# Patient Record
Sex: Female | Born: 1995 | Race: White | Hispanic: Yes | Marital: Single | Smoking: Never smoker
Health system: Southern US, Community
[De-identification: ages and names within clinical notes are randomized; demographics above are authoritative.]

## PROBLEM LIST (undated history)

## (undated) HISTORY — PX: TONSILLECTOMY: SUR1361

## (undated) HISTORY — PX: ADENOIDECTOMY: SHX5191

---
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
## 2016-02-04 ENCOUNTER — Encounter (HOSPITAL_COMMUNITY): Payer: Self-pay | Admitting: Emergency Medicine

## 2016-02-04 ENCOUNTER — Emergency Department (HOSPITAL_COMMUNITY)
Admission: EM | Admit: 2016-02-04 | Discharge: 2016-02-05 | Disposition: A | Discharge status: Home / Self Care | Payer: Medicaid Other | Attending: Emergency Medicine | Admitting: Emergency Medicine

## 2016-02-04 DIAGNOSIS — K529 Noninfective gastroenteritis and colitis, unspecified: Secondary | ICD-10-CM | POA: Diagnosis not present

## 2016-02-04 DIAGNOSIS — R1031 Right lower quadrant pain: Secondary | ICD-10-CM

## 2016-02-04 DIAGNOSIS — R1013 Epigastric pain: Secondary | ICD-10-CM | POA: Diagnosis present

## 2016-02-04 LAB — COMPREHENSIVE METABOLIC PANEL
ALK PHOS: 50 U/L (ref 38–126)
ALT: 12 U/L — AB (ref 14–54)
AST: 18 U/L (ref 15–41)
Albumin: 4.1 g/dL (ref 3.5–5.0)
Anion gap: 7 (ref 5–15)
BILIRUBIN TOTAL: 0.5 mg/dL (ref 0.3–1.2)
BUN: 5 mg/dL — ABNORMAL LOW (ref 6–20)
CALCIUM: 9.2 mg/dL (ref 8.9–10.3)
CO2: 26 mmol/L (ref 22–32)
CREATININE: 0.66 mg/dL (ref 0.44–1.00)
Chloride: 105 mmol/L (ref 101–111)
Glucose, Bld: 102 mg/dL — ABNORMAL HIGH (ref 65–99)
Potassium: 3.6 mmol/L (ref 3.5–5.1)
Sodium: 138 mmol/L (ref 135–145)
TOTAL PROTEIN: 6.8 g/dL (ref 6.5–8.1)

## 2016-02-04 LAB — CBC
HCT: 28.4 % — ABNORMAL LOW (ref 36.0–46.0)
Hemoglobin: 8.7 g/dL — ABNORMAL LOW (ref 12.0–15.0)
MCH: 20.3 pg — AB (ref 26.0–34.0)
MCHC: 30.6 g/dL (ref 30.0–36.0)
MCV: 66.2 fL — ABNORMAL LOW (ref 78.0–100.0)
PLATELETS: 220 10*3/uL (ref 150–400)
RBC: 4.29 MIL/uL (ref 3.87–5.11)
RDW: 17.4 % — ABNORMAL HIGH (ref 11.5–15.5)
WBC: 7.5 10*3/uL (ref 4.0–10.5)

## 2016-02-04 LAB — URINALYSIS, MICROSCOPIC (REFLEX)

## 2016-02-04 LAB — URINALYSIS, ROUTINE W REFLEX MICROSCOPIC
Bilirubin Urine: NEGATIVE
Glucose, UA: NEGATIVE mg/dL
Ketones, ur: NEGATIVE mg/dL
NITRITE: NEGATIVE
PROTEIN: NEGATIVE mg/dL
Specific Gravity, Urine: 1.01 (ref 1.005–1.030)
pH: 6.5 (ref 5.0–8.0)

## 2016-02-04 LAB — I-STAT BETA HCG BLOOD, ED (MC, WL, AP ONLY): I-stat hCG, quantitative: <5 m[IU]/mL (ref <5)

## 2016-02-04 LAB — LIPASE, BLOOD: Lipase: 19 U/L (ref 11–51)

## 2016-02-04 MED ORDER — SODIUM CHLORIDE 0.9 % IV BOLUS (SEPSIS)
1000.0000 mL | Freq: Once | INTRAVENOUS | Status: AC
  Administered 2016-02-05: 1000 mL via INTRAVENOUS

## 2016-02-04 MED ORDER — ONDANSETRON HCL 4 MG/2ML IJ SOLN
4.0000 mg | Freq: Once | INTRAMUSCULAR | Status: AC
  Administered 2016-02-05: 4 mg via INTRAVENOUS
  Filled 2016-02-04: qty 2

## 2016-02-04 MED ORDER — MORPHINE SULFATE (PF) 4 MG/ML IV SOLN
4.0000 mg | Freq: Once | INTRAVENOUS | Status: AC
  Administered 2016-02-05: 4 mg via INTRAVENOUS
  Filled 2016-02-04: qty 1

## 2016-02-04 MED ORDER — IOPAMIDOL (ISOVUE-300) INJECTION 61%
INTRAVENOUS | Status: AC
  Filled 2016-02-04: qty 30

## 2016-02-04 NOTE — ED Triage Notes (Signed)
Pt. Stated, I'v had abdominal pain in the middle toward the right since last night. Its an aching pain that's constant , no other symptoms.

## 2016-02-04 NOTE — ED Notes (Signed)
Pt having substernal pain 8/10 starting last night. Pt has taken Advil 400mg  for the pain with no relief. Pt had last BM last night, is passing flatus, and feels nauseous but has not vomited.

## 2016-02-05 ENCOUNTER — Emergency Department (HOSPITAL_COMMUNITY): Payer: Medicaid Other

## 2016-02-05 MED ORDER — IOPAMIDOL (ISOVUE-300) INJECTION 61%
INTRAVENOUS | Status: AC
  Administered 2016-02-05: 100 mL
  Filled 2016-02-05: qty 100

## 2016-02-05 MED ORDER — SODIUM CHLORIDE 0.9 % IV BOLUS (SEPSIS)
1000.0000 mL | Freq: Once | INTRAVENOUS | Status: AC
  Administered 2016-02-05: 1000 mL via INTRAVENOUS

## 2016-02-05 MED ORDER — ONDANSETRON HCL 4 MG PO TABS: 4.0000 mg | ORAL_TABLET | Freq: Four times a day (QID) | ORAL | 0 refills | Status: DC

## 2016-02-05 NOTE — ED Provider Notes (Signed)
[facility]-EMERGENCY DEPT Provider Note   CSN: [id number] Arrival date & time: 02/04/16  1824     History   Chief Complaint Chief Complaint  Patient presents with  . Abdominal Pain    rt side    HPI Carol Turner is a 20 y.o. female.  Patient without significant medical history presents with complaint of epigastric abdominal pain since yesterday. She has nausea with vomiting and denies fever. The pain starts in the epigastrium then radiates to the RLQ and then improves with some pain persistently in the epigastrium. No constipation or diarrhea. She reports loss of appetite today. Normal menses from 11/28 through 12/05. No vaginal bleeding or discharge. No urinary symptoms, No fever.   The history is provided by the patient.    History reviewed. No pertinent past medical history.  There are no active problems to display for this patient.   History reviewed. No pertinent surgical history.  OB History    No data available       Home Medications    Prior to Admission medications   Medication Sig Start Date End Date Taking? Authorizing Provider  ibuprofen (ADVIL,MOTRIN) 200 MG tablet Take 400 mg by mouth every 6 (six) hours as needed for mild pain.   Yes Historical Provider, MD    Family History No family history on file.  Social History Social History  Substance Use Topics  . Smoking status: Never Smoker  . Smokeless tobacco: Never Used  . Alcohol use No     Allergies   Mushroom extract complex   Review of Systems Review of Systems  Constitutional: Positive for appetite change. Negative for fever.  Respiratory: Negative.   Cardiovascular: Negative.   Gastrointestinal: Positive for abdominal pain (See HPI.) and nausea. Negative for constipation, diarrhea and vomiting.       No melena or hematochezia.   Genitourinary: Negative.  Negative for dysuria, pelvic pain, vaginal bleeding and vaginal discharge.  Musculoskeletal: Positive for back pain.       Patient  complained of low back pain for 2-3 days prior to today when back pain stopped.  Skin: Negative.   Neurological: Negative.  Negative for weakness and light-headedness.     Physical Exam Updated Vital Signs BP 128/61   Pulse 111   Temp 98.5 F (36.9 C) (Oral)   Resp 18   Ht 5' 2.5" (1.588 m)   Wt 51.1 kg   LMP 01/25/2016   SpO2 100%   BMI 20.26 kg/m   Physical Exam  Constitutional: She is oriented to person, place, and time. She appears well-developed and well-nourished.  HENT:  Head: Normocephalic.  Neck: Normal range of motion. Neck supple.  Cardiovascular: Normal rate and regular rhythm.   Pulmonary/Chest: Effort normal and breath sounds normal.  Abdominal: Soft. Bowel sounds are normal. There is tenderness (No epigastric tenderness. RLQ tenderness to palpation without rebound.). There is no rebound and no guarding.  Musculoskeletal: Normal range of motion.  Neurological: She is alert and oriented to person, place, and time.  Skin: Skin is warm and dry. No rash noted.  Psychiatric: She has a normal mood and affect.  Nursing note and vitals reviewed.    ED Treatments / Results  Labs (all labs ordered are listed, but only abnormal results are displayed) Labs Reviewed  COMPREHENSIVE METABOLIC PANEL - Abnormal; Notable for the following:       Result Value   Glucose, Bld 102 (*)    BUN 5 (*)    ALT 12 (*)  All other components within normal limits  CBC - Abnormal; Notable for the following:    Hemoglobin 8.7 (*)    HCT 28.4 (*)    MCV 66.2 (*)    MCH 20.3 (*)    RDW 17.4 (*)    All other components within normal limits  URINALYSIS, ROUTINE W REFLEX MICROSCOPIC - Abnormal; Notable for the following:    Hgb urine dipstick TRACE (*)    Leukocytes, UA TRACE (*)    All other components within normal limits  URINALYSIS, MICROSCOPIC (REFLEX) - Abnormal; Notable for the following:    Bacteria, UA RARE (*)    Squamous Epithelial / LPF 6-30 (*)    All other  components within normal limits  LIPASE, BLOOD  I-STAT BETA HCG BLOOD, ED ([facility], [facility], [facility] ONLY)  Ct Abdomen Pelvis W Contrast  Result Date: 02/05/2016 CLINICAL DATA:  20 year old female with epigastric and right abdominal pain. Right lower quadrant tenderness. EXAM: CT ABDOMEN AND PELVIS WITH CONTRAST TECHNIQUE: Multidetector CT imaging of the abdomen and pelvis was performed using the standard protocol following bolus administration of intravenous contrast. CONTRAST:  100mL ISOVUE-300 IOPAMIDOL (ISOVUE-300) INJECTION 61% COMPARISON:  None. FINDINGS: Lower chest: The visualized lung bases are clear. No intra-abdominal free air. Trace free fluid may be present within the pelvis. Hepatobiliary: No focal liver abnormality is seen. No gallstones, gallbladder wall thickening, or biliary dilatation. Pancreas: Unremarkable. No pancreatic ductal dilatation or surrounding inflammatory changes. Spleen: Normal in size without focal abnormality. Adrenals/Urinary Tract: Adrenal glands are unremarkable. Kidneys are normal, without renal calculi, focal lesion, or hydronephrosis. Bladder is unremarkable. Stomach/Bowel: Oral contrast opacifies the stomach and multiple loops of small bowel extending into the colon without evidence of bowel obstruction. There is mild thickened appearance of the colonic folds primarily involving the ascending colon which may represent mild colitis. Correlation with clinical exam recommended. The visualized appendix is normal. Vascular/Lymphatic: No significant vascular findings are present. Top-normal right lower quadrant and pericecal lymph nodes, likely reactive. Reproductive: The uterus is anteverted. Small amount of fluid is noted within the endometrial canal. There are bilateral ovarian follicles. Dominant right ovarian follicle/cyst measures up to 2.0 x 2.3 cm. Ultrasound may provide better evaluation of the pelvic structures. Other: Small fat containing umbilical hernia. Musculoskeletal: No  acute or significant osseous findings. IMPRESSION: Mild thickened appearance of the colonic folds may represent mild colitis. Clinical correlation is recommended. No bowel obstruction. Normal appendix. Right ovarian dominant follicles/cysts measuring up to 2.3 cm in diameter. Small fluid noted within the endometrial canal. Ultrasound may provide better evaluation of the pelvic structures. Electronically Signed   By: Elgie CollardArash  Radparvar M.D.   On: 02/05/2016 01:56   Results for orders placed or performed during the hospital encounter of 02/04/16  Lipase, blood  Result Value Ref Range   Lipase 19 11 - 51 U/L  Comprehensive metabolic panel  Result Value Ref Range   Sodium 138 135 - 145 mmol/L   Potassium 3.6 3.5 - 5.1 mmol/L   Chloride 105 101 - 111 mmol/L   CO2 26 22 - 32 mmol/L   Glucose, Bld 102 (H) 65 - 99 mg/dL   BUN 5 (L) 6 - 20 mg/dL   Creatinine, Ser 0.980.66 0.44 - 1.00 mg/dL   Calcium 9.2 8.9 - 11.910.3 mg/dL   Total Protein 6.8 6.5 - 8.1 g/dL   Albumin 4.1 3.5 - 5.0 g/dL   AST 18 15 - 41 U/L   ALT 12 (L) 14 - 54 U/L  Alkaline Phosphatase 50 38 - 126 U/L   Total Bilirubin 0.5 0.3 - 1.2 mg/dL   GFR calc non Af Amer >60 >60 mL/min   GFR calc Af Amer >60 >60 mL/min   Anion gap 7 5 - 15  CBC  Result Value Ref Range   WBC 7.5 4.0 - 10.5 K/uL   RBC 4.29 3.87 - 5.11 MIL/uL   Hemoglobin 8.7 (L) 12.0 - 15.0 g/dL   HCT 16.128.4 (L) 09.636.0 - 04.546.0 %   MCV 66.2 (L) 78.0 - 100.0 fL   MCH 20.3 (L) 26.0 - 34.0 pg   MCHC 30.6 30.0 - 36.0 g/dL   RDW 40.917.4 (H) 81.111.5 - 91.415.5 %   Platelets 220 150 - 400 K/uL  Urinalysis, Routine w reflex microscopic  Result Value Ref Range   Color, Urine YELLOW YELLOW   APPearance CLEAR CLEAR   Specific Gravity, Urine 1.010 1.005 - 1.030   pH 6.5 5.0 - 8.0   Glucose, UA NEGATIVE NEGATIVE mg/dL   Hgb urine dipstick TRACE (A) NEGATIVE   Bilirubin Urine NEGATIVE NEGATIVE   Ketones, ur NEGATIVE NEGATIVE mg/dL   Protein, ur NEGATIVE NEGATIVE mg/dL   Nitrite NEGATIVE  NEGATIVE   Leukocytes, UA TRACE (A) NEGATIVE  Urinalysis, Microscopic (reflex)  Result Value Ref Range   RBC / HPF 0-5 0 - 5 RBC/hpf   WBC, UA 0-5 0 - 5 WBC/hpf   Bacteria, UA RARE (A) NONE SEEN   Squamous Epithelial / LPF 6-30 (A) NONE SEEN  I-Stat beta hCG blood, ED  Result Value Ref Range   I-stat hCG, quantitative <5.0 <5 mIU/mL   Comment 3             EKG  EKG Interpretation None       Radiology No results found.  Procedures Procedures (including critical care time)  Medications Ordered in ED Medications  iopamidol (ISOVUE-300) 61 % injection (not administered)  sodium chloride 0.9 % bolus 1,000 mL (1,000 mLs Intravenous New Bag/Given 02/05/16 0005)  ondansetron (ZOFRAN) injection 4 mg (4 mg Intravenous Given 02/05/16 0005)  morphine 4 MG/ML injection 4 mg (4 mg Intravenous Given 02/05/16 0005)     Initial Impression / Assessment and Plan / ED Course  I have reviewed the triage vital signs and the nursing notes.  Pertinent labs & imaging results that were available during my care of the patient were reviewed by me and considered in my medical decision making (see chart for details).  Clinical Course     Patient presents with complaint of epigastric pain but tender only in the RLQ abdomen. No fever.  Labs and imaging are unremarkable except for ?changes of colitis in ascending colon. There is a right ovarian cyst with pelvic fluid. Pelvic exam done and is unremarkable. No discharge, no tenderness. Not felt to be contributory to her pain. Ultrasound offered, however, patient declined.  Discussed CT scan results and need for supportive care.   She can be discharged home and is encouraged to follow up with GYN for beginning of PAP smear and routine GYN health. Return precautions discussed.   Final Clinical Impressions(s) / ED Diagnoses   Final diagnoses:  None   1. Abdominal pain 2. Colitis, mild  New Prescriptions New Prescriptions   No medications on  file     Elpidio AnisShari Anjanae Woehrle, Cordelia Poche-C 02/05/16 [id number]    Rolland PorterMark James, MD 02/09/16 [phone]

## 2016-02-05 NOTE — Discharge Instructions (Signed)
RETURN TO THE EMERGENCY DEPARTMENT IF YOU HAVE SEVERE PAIN, HIGH FEVER, UNCONTROLLED VOMITING OR NEW CONCERN.

## 2016-02-05 NOTE — ED Notes (Signed)
Pt verbalized understanding discharge instructions and denies any further needs or questions at this time. VS stable, ambulatory and steady gait.   

## 2017-11-09 IMAGING — CT CT ABD-PELV W/ CM
2 of 4 series · 15 of 46 positions shown, 17 images · IV contrast (Omni 300)
Comparison: None.

CLINICAL DATA: 20-year-old female with epigastric and right
abdominal pain. Right lower quadrant tenderness.

EXAM:
CT ABDOMEN AND PELVIS WITH CONTRAST
TECHNIQUE: Multidetector CT imaging of the abdomen and pelvis was performed
using the standard protocol following bolus administration of
intravenous contrast.
CONTRAST:  100mL 98PYK1-T44 IOPAMIDOL (98PYK1-T44) INJECTION 61%

[Series 2: a/p w/ 5mm · axial · 0.57mm/px · z∈[+410,+790]mm · 12 of 84 slices shown, 14 images]
[im 4/84  soft-tissue]
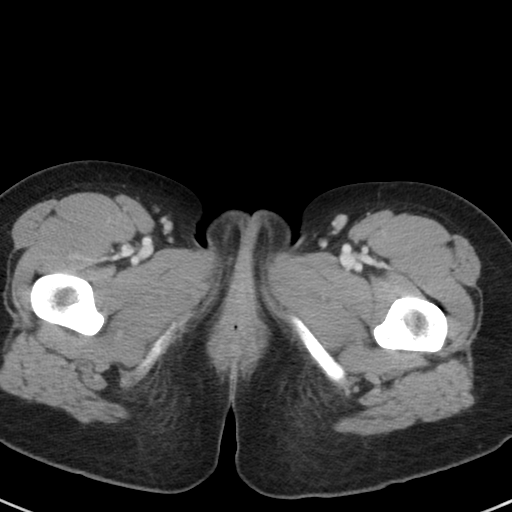
[im 4/84  bone]
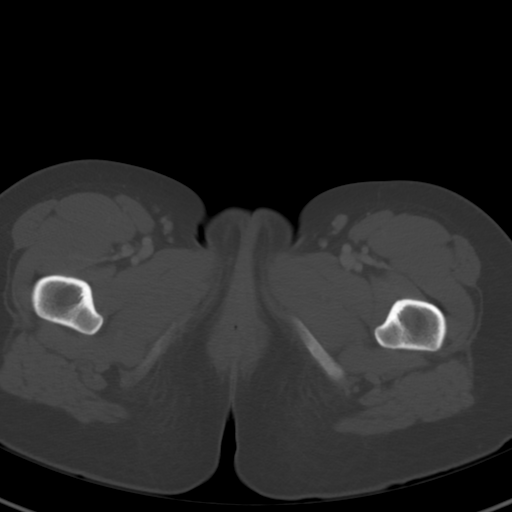
[im 11/84  soft-tissue]
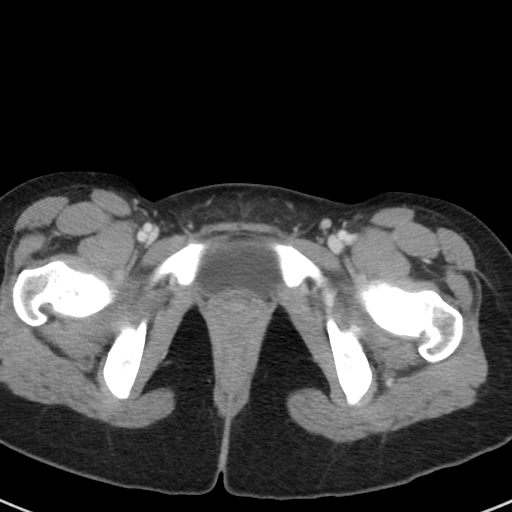
[im 19/84  soft-tissue]
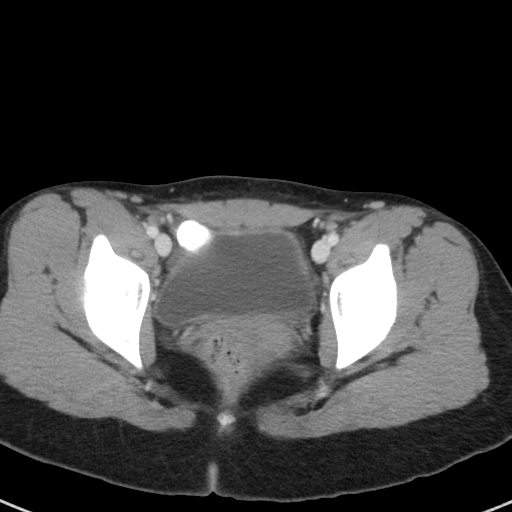
[im 26/84  soft-tissue]
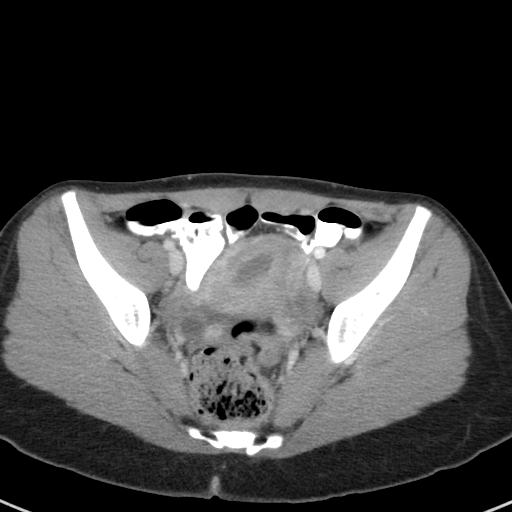
[im 33/84  soft-tissue]
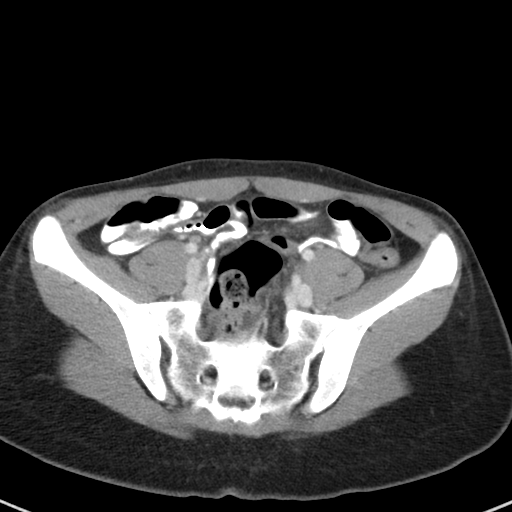
[im 40/84  soft-tissue]
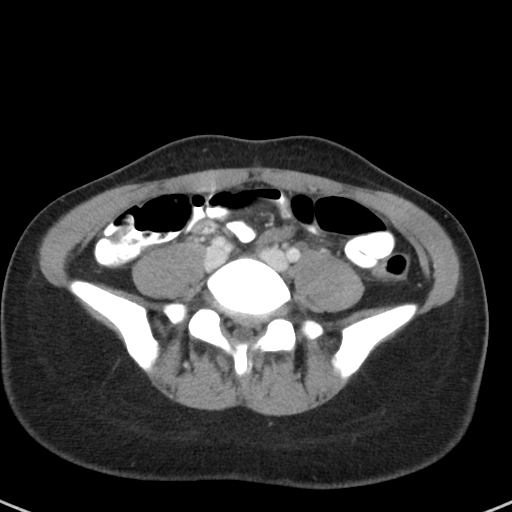
[im 44/84  soft-tissue]
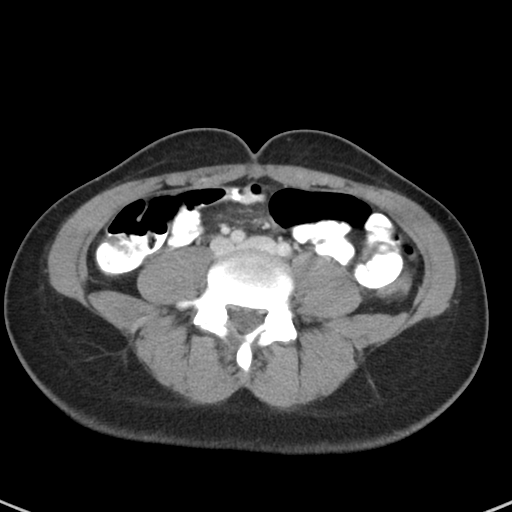
[im 51/84  soft-tissue]
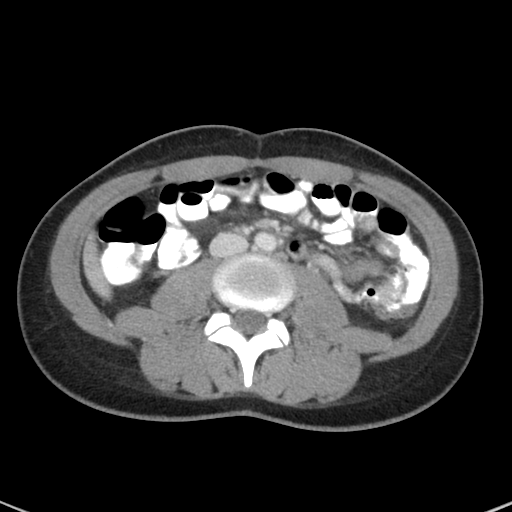
[im 58/84  soft-tissue]
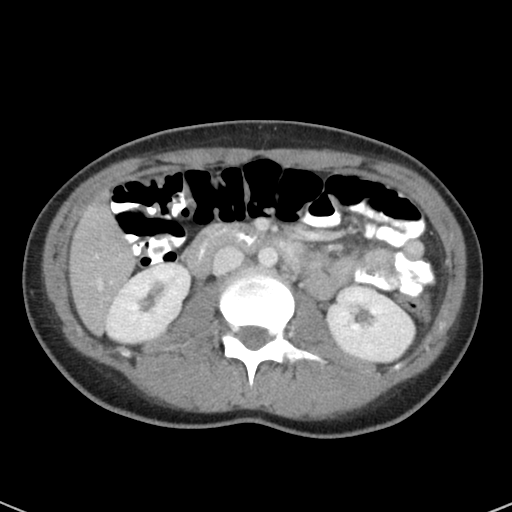
[im 58/84  bone]
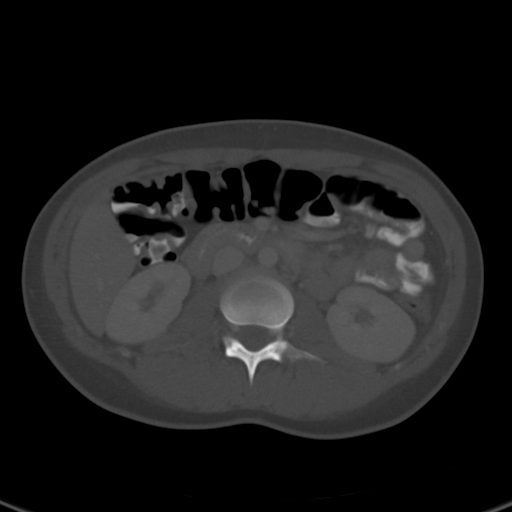
[im 65/84  soft-tissue]
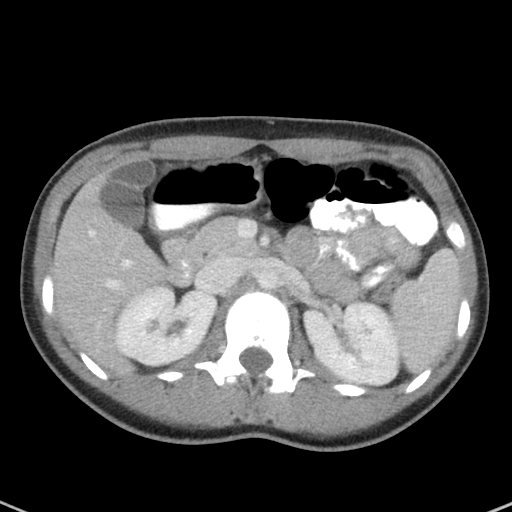
[im 73/84  soft-tissue]
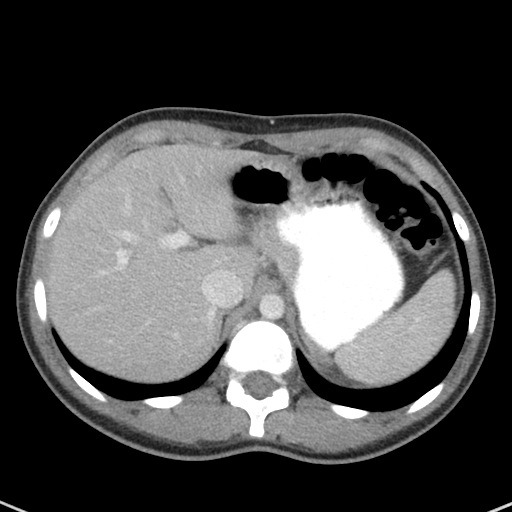
[im 80/84  soft-tissue]
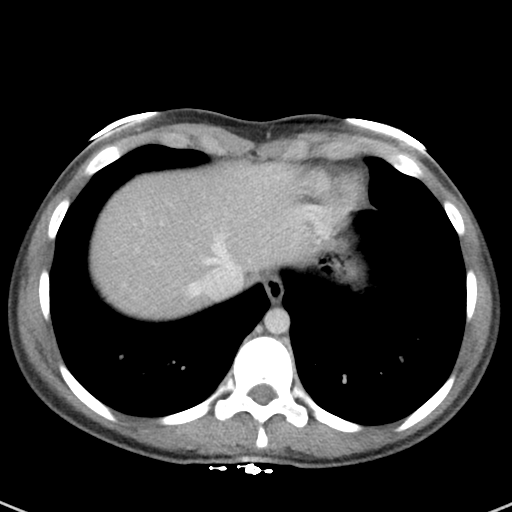

[Series 5: a/p w/ cor · coronal · 0.63mm/px · 3 of 117 slices shown]
[im 39/117  soft-tissue]
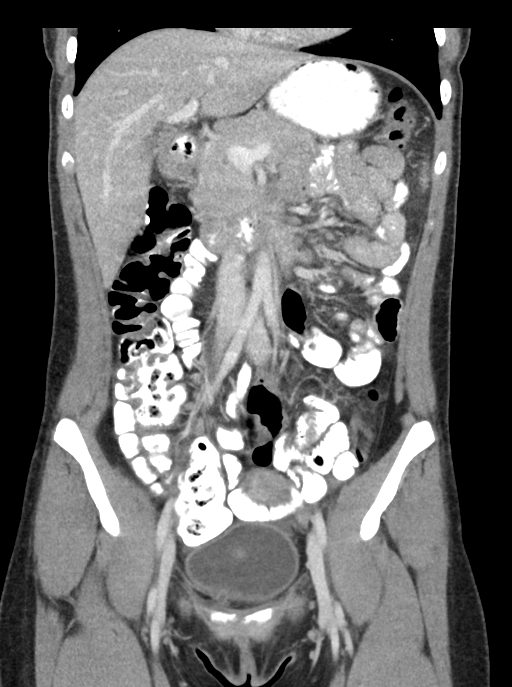
[im 52/117  soft-tissue]
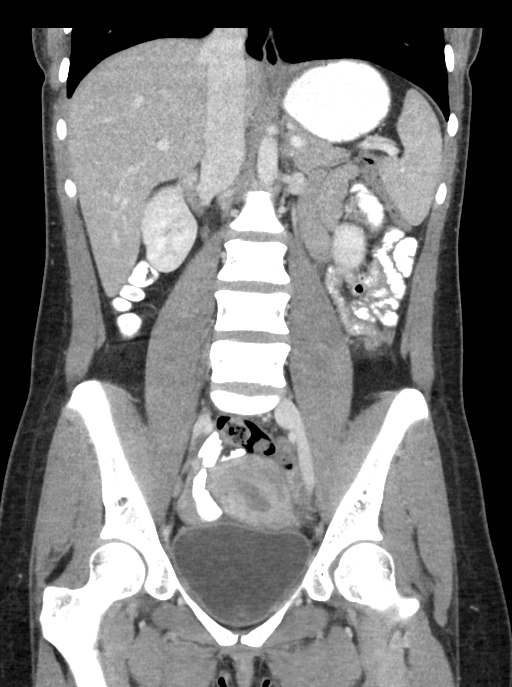
[im 65/117  soft-tissue]
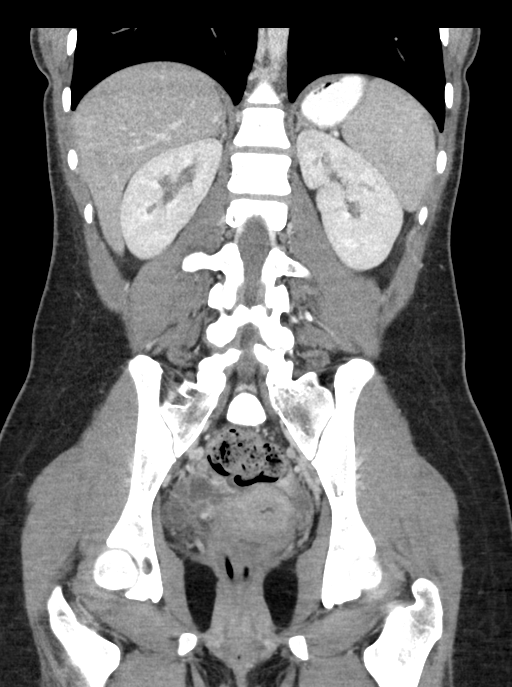

[15 of 46 positions shown; findings below may reference images not displayed]

FINDINGS: Lower chest: The visualized lung bases are clear.

No intra-abdominal free air. Trace free fluid may be present within
the pelvis.

Hepatobiliary: No focal liver abnormality is seen. No gallstones,
gallbladder wall thickening, or biliary dilatation.

Pancreas: Unremarkable. No pancreatic ductal dilatation or
surrounding inflammatory changes.

Spleen: Normal in size without focal abnormality.

Adrenals/Urinary Tract: Adrenal glands are unremarkable. Kidneys are
normal, without renal calculi, focal lesion, or hydronephrosis.
Bladder is unremarkable.

Stomach/Bowel: Oral contrast opacifies the stomach and multiple
loops of small bowel extending into the colon without evidence of
bowel obstruction. There is mild thickened appearance of the colonic
folds primarily involving the ascending colon which may represent
mild colitis. Correlation with clinical exam recommended. The
visualized appendix is normal.

Vascular/Lymphatic: No significant vascular findings are present.
Top-normal right lower quadrant and pericecal lymph nodes, likely
reactive.

Reproductive: The uterus is anteverted. Small amount of fluid is
noted within the endometrial canal. There are bilateral ovarian
follicles. Dominant right ovarian follicle/cyst measures up to 2.0 x
2.3 cm. Ultrasound may provide better evaluation of the pelvic
structures.

Other: Small fat containing umbilical hernia.

Musculoskeletal: No acute or significant osseous findings.
IMPRESSION: Mild thickened appearance of the colonic folds may represent mild
colitis. Clinical correlation is recommended. No bowel obstruction.
Normal appendix.

Right ovarian dominant follicles/cysts measuring up to 2.3 cm in
diameter. Small fluid noted within the endometrial canal. Ultrasound
may provide better evaluation of the pelvic structures.

## 2019-05-28 ENCOUNTER — Ambulatory Visit: Payer: Medicaid Other | Attending: Internal Medicine

## 2019-05-28 DIAGNOSIS — Z23 Encounter for immunization: Secondary | ICD-10-CM

## 2019-05-28 NOTE — Progress Notes (Signed)
   Covid-19 Vaccination Clinic  Name:  Carol Turner    MRN: [id number] DOB: Jan 12, 1996  05/28/2019  [name] was observed post Covid-19 immunization for 15 minutes without incident. She was provided with Vaccine Information Sheet and instruction to access the V-Safe system.   [name] was instructed to call 911 with any severe reactions post vaccine: Marland Kitchen Difficulty breathing  . Swelling of face and throat  . A fast heartbeat  . A bad rash all over body  . Dizziness and weakness   Immunizations Administered    Name Date Dose VIS Date Route   Pfizer COVID-19 Vaccine 05/28/2019 11:14 AM 0.3 mL 02/04/2019 Intramuscular   Manufacturer: [employer], [employer]   Lot: [id number]   NDC: [address]-6

## 2019-06-22 ENCOUNTER — Ambulatory Visit: Payer: Medicaid Other | Attending: Internal Medicine

## 2019-06-22 DIAGNOSIS — Z23 Encounter for immunization: Secondary | ICD-10-CM

## 2019-06-22 NOTE — Progress Notes (Signed)
   Covid-19 Vaccination Clinic  Name:  Carol Turner    MRN: [id number] DOB: Aug 23, 1995  06/22/2019  Carol Turner was observed post Covid-19 immunization for 15 minutes without incident. She was provided with Vaccine Information Sheet and instruction to access the V-Safe system.   Carol Turner was instructed to call 911 with any severe reactions post vaccine: Marland Kitchen Difficulty breathing  . Swelling of face and throat  . A fast heartbeat  . A bad rash all over body  . Dizziness and weakness   Immunizations Administered    Name Date Dose VIS Date Route   Pfizer COVID-19 Vaccine 06/22/2019 11:14 AM 0.3 mL 04/20/2018 Intramuscular   Manufacturer: [employer], [employer]   Lot: [id number]   NDC: [address]-7

## 2022-03-11 ENCOUNTER — Emergency Department (HOSPITAL_BASED_OUTPATIENT_CLINIC_OR_DEPARTMENT_OTHER)
Admission: EM | Admit: 2022-03-11 | Discharge: 2022-03-11 | Disposition: A | Discharge status: Home / Self Care | Payer: Self-pay | Attending: Emergency Medicine | Admitting: Emergency Medicine

## 2022-03-11 ENCOUNTER — Encounter (HOSPITAL_BASED_OUTPATIENT_CLINIC_OR_DEPARTMENT_OTHER): Payer: Self-pay

## 2022-03-11 ENCOUNTER — Emergency Department (HOSPITAL_BASED_OUTPATIENT_CLINIC_OR_DEPARTMENT_OTHER): Payer: Self-pay

## 2022-03-11 ENCOUNTER — Other Ambulatory Visit: Payer: Self-pay

## 2022-03-11 DIAGNOSIS — R112 Nausea with vomiting, unspecified: Secondary | ICD-10-CM | POA: Insufficient documentation

## 2022-03-11 DIAGNOSIS — R1011 Right upper quadrant pain: Secondary | ICD-10-CM | POA: Insufficient documentation

## 2022-03-11 LAB — URINALYSIS, MICROSCOPIC (REFLEX): WBC, UA: NONE SEEN WBC/hpf (ref 0–5)

## 2022-03-11 LAB — URINALYSIS, ROUTINE W REFLEX MICROSCOPIC
Bilirubin Urine: NEGATIVE
Glucose, UA: NEGATIVE mg/dL
Ketones, ur: NEGATIVE mg/dL
Leukocytes,Ua: NEGATIVE
Nitrite: NEGATIVE
Protein, ur: NEGATIVE mg/dL
Specific Gravity, Urine: 1.02 (ref 1.005–1.030)
pH: 5.5 (ref 5.0–8.0)

## 2022-03-11 LAB — CBC
HCT: 39.3 % (ref 36.0–46.0)
Hemoglobin: 13.4 g/dL (ref 12.0–15.0)
MCH: 29.6 pg (ref 26.0–34.0)
MCHC: 34.1 g/dL (ref 30.0–36.0)
MCV: 86.8 fL (ref 80.0–100.0)
Platelets: 271 10*3/uL (ref 150–400)
RBC: 4.53 MIL/uL (ref 3.87–5.11)
RDW: 12.3 % (ref 11.5–15.5)
WBC: 9 10*3/uL (ref 4.0–10.5)
nRBC: 0 % (ref 0.0–0.2)

## 2022-03-11 LAB — COMPREHENSIVE METABOLIC PANEL
ALT: 20 U/L (ref 0–44)
AST: 20 U/L (ref 15–41)
Albumin: 3.9 g/dL (ref 3.5–5.0)
Alkaline Phosphatase: 48 U/L (ref 38–126)
Anion gap: 10 (ref 5–15)
BUN: 8 mg/dL (ref 6–20)
CO2: 23 mmol/L (ref 22–32)
Calcium: 8.5 mg/dL — ABNORMAL LOW (ref 8.9–10.3)
Chloride: 103 mmol/L (ref 98–111)
Creatinine, Ser: 0.66 mg/dL (ref 0.44–1.00)
GFR, Estimated: >60 mL/min (ref >60)
Glucose, Bld: 100 mg/dL — ABNORMAL HIGH (ref 70–99)
Potassium: 3.3 mmol/L — ABNORMAL LOW (ref 3.5–5.1)
Sodium: 136 mmol/L (ref 135–145)
Total Bilirubin: 0.3 mg/dL (ref 0.3–1.2)
Total Protein: 7.3 g/dL (ref 6.5–8.1)

## 2022-03-11 LAB — LIPASE, BLOOD: Lipase: 37 U/L (ref 11–51)

## 2022-03-11 LAB — PREGNANCY, URINE: Preg Test, Ur: NEGATIVE

## 2022-03-11 MED ORDER — ONDANSETRON 4 MG PO TBDP: 4.0000 mg | ORAL_TABLET | Freq: Three times a day (TID) | ORAL | 0 refills | Status: DC | PRN

## 2022-03-11 MED ORDER — NAPROXEN 500 MG PO TABS: 500.0000 mg | ORAL_TABLET | Freq: Two times a day (BID) | ORAL | 0 refills | Status: DC

## 2022-03-11 MED ORDER — ONDANSETRON HCL 4 MG PO TABS: 4.0000 mg | ORAL_TABLET | Freq: Four times a day (QID) | ORAL | 0 refills | Status: AC

## 2022-03-11 MED ORDER — ONDANSETRON 4 MG PO TBDP
4.0000 mg | ORAL_TABLET | Freq: Once | ORAL | Status: AC | PRN
  Administered 2022-03-11: 4 mg via ORAL
  Filled 2022-03-11: qty 1

## 2022-03-11 MED ORDER — NAPROXEN 500 MG PO TABS: 500.0000 mg | ORAL_TABLET | Freq: Two times a day (BID) | ORAL | 0 refills | Status: AC

## 2022-03-11 MED ORDER — IOHEXOL 300 MG/ML  SOLN
100.0000 mL | Freq: Once | INTRAMUSCULAR | Status: AC | PRN
  Administered 2022-03-11: 100 mL via INTRAVENOUS

## 2022-03-11 MED ORDER — MORPHINE SULFATE (PF) 4 MG/ML IV SOLN
4.0000 mg | Freq: Once | INTRAVENOUS | Status: AC
  Administered 2022-03-11: 4 mg via INTRAVENOUS
  Filled 2022-03-11: qty 1

## 2022-03-11 NOTE — Discharge Instructions (Addendum)
Please take your medications as prescribed. Please take tylenol/ibuprofen for pain. I recommend close follow-up with PCP for reevaluation.  Please do not hesitate to return to emergency department if worrisome signs symptoms we discussed become apparent. 

## 2022-03-11 NOTE — ED Triage Notes (Signed)
Pt c/o sharp RUQ abdominal pain and nausea starting this morning.  Pain score 8/10.  Denies dysuria and diarrhea.  Pt reports pain increases w/ movement.

## 2022-03-11 NOTE — ED Provider Notes (Signed)
[facility] EM[facility]ENC[facility] DEPARTMENT Provider Note   CSN: [id number] Arrival date & time: 03/11/22  1429     History  Chief Complaint  Patient presents with   Abdominal Pain   Nausea    Carol Turner is a 27 y.o. female with no significant past with history presenting today for evaluation of acute onset abdominal pain started this morning.  She describes the pain as constant, located in the right upper quadrant, worse with movement.  She denies fever, bowel changes, urinary symptoms, abnormal vaginal discharge.  She endorses nausea and vomiting.  No history of abdominal surgeries.  No chest pain or shortness of breath.   Abdominal Pain   No past medical history on file. Past Surgical History:  Procedure Laterality Date   ADENOIDECTOMY     TONSILLECTOMY       Home Medications Prior to Admission medications   Medication Sig Start Date End Date Taking? Authorizing Provider  naproxen (NAPROSYN) 500 MG tablet Take 1 tablet (500 mg total) by mouth 2 (two) times daily. 03/11/22  Yes Rex Kras, PA  ondansetron (ZOFRAN-ODT) 4 MG disintegrating tablet Take 1 tablet (4 mg total) by mouth every 8 (eight) hours as needed for nausea or vomiting. 03/11/22  Yes Rex Kras, PA  ibuprofen (ADVIL,MOTRIN) 200 MG tablet Take 400 mg by mouth every 6 (six) hours as needed for mild pain.    [provider]  ondansetron (ZOFRAN) 4 MG tablet Take 1 tablet (4 mg total) by mouth every 6 (six) hours. 02/05/16   Charlann Lange, PA-C      Allergies    Mushroom extract complex    Review of Systems   Review of Systems  Gastrointestinal:  Positive for abdominal pain.    Physical Exam Updated Vital Signs BP 117/66   Pulse 88   Temp 98.4 F (36.9 C)   Resp 17   Ht 5\' 3"  (1.6 m)   Wt 57.6 kg   LMP 03/04/2022 (Approximate)   SpO2 100%   BMI 22.50 kg/m  Physical Exam Vitals and nursing note reviewed.  Constitutional:      Appearance: Normal appearance.  HENT:     Head: Normocephalic  and atraumatic.     Mouth/Throat:     Mouth: Mucous membranes are moist.  Eyes:     General: No scleral icterus. Cardiovascular:     Rate and Rhythm: Normal rate and regular rhythm.     Pulses: Normal pulses.     Heart sounds: Normal heart sounds.  Pulmonary:     Effort: Pulmonary effort is normal.     Breath sounds: Normal breath sounds.  Abdominal:     General: Abdomen is flat.     Palpations: Abdomen is soft.     Tenderness: There is abdominal tenderness in the right upper quadrant.  Musculoskeletal:        General: No deformity.  Skin:    General: Skin is warm.     Findings: No rash.  Neurological:     General: No focal deficit present.     Mental Status: She is alert.  Psychiatric:        Mood and Affect: Mood normal.     ED Results / Procedures / Treatments   Labs (all labs ordered are listed, but only abnormal results are displayed) Labs Reviewed  COMPREHENSIVE METABOLIC PANEL - Abnormal; Notable for the following components:      Result Value   Potassium 3.3 (*)    Glucose, Bld 100 (*)  Calcium 8.5 (*)    All other components within normal limits  URINALYSIS, ROUTINE W REFLEX MICROSCOPIC - Abnormal; Notable for the following components:   Hgb urine dipstick LARGE (*)    All other components within normal limits  URINALYSIS, MICROSCOPIC (REFLEX) - Abnormal; Notable for the following components:   Bacteria, UA RARE (*)    All other components within normal limits  LIPASE, BLOOD  CBC  PREGNANCY, URINE    EKG None  Radiology CT ABDOMEN PELVIS W CONTRAST  Result Date: 03/11/2022 CLINICAL DATA:  Right upper quadrant pain EXAM: CT ABDOMEN AND PELVIS WITH CONTRAST TECHNIQUE: Multidetector CT imaging of the abdomen and pelvis was performed using the standard protocol following bolus administration of intravenous contrast. RADIATION DOSE REDUCTION: This exam was performed according to the departmental dose-optimization program which includes automated exposure  control, adjustment of the mA and/or kV according to patient size and/or use of iterative reconstruction technique. CONTRAST:  OMNIPAQUE IOHEXOL 300 MG/ML  SOLN COMPARISON:  02/05/2016 FINDINGS: Lower chest: No acute abnormality Hepatobiliary: No focal hepatic abnormality. Gallbladder unremarkable. Pancreas: No focal abnormality or ductal dilatation. Spleen: No focal abnormality.  Normal size. Adrenals/Urinary Tract: No adrenal abnormality. No focal renal abnormality. No stones or hydronephrosis. Urinary bladder is unremarkable. Stomach/Bowel: Normal appendix. Stomach, large and small bowel grossly unremarkable. Vascular/Lymphatic: No evidence of aneurysm or adenopathy. Reproductive: Uterus and adnexa unremarkable.  No mass. Other: Trace free fluid in the cul-de-sac.  No free air. Musculoskeletal: No acute bony abnormality. IMPRESSION: No acute findings in the abdomen or pelvis. Electronically Signed   By: Charlett Nose M.D.   On: 03/11/2022 18:15    Procedures Procedures    Medications Ordered in ED Medications  ondansetron (ZOFRAN-ODT) disintegrating tablet 4 mg (4 mg Oral Given 03/11/22 1449)  morphine (PF) 4 MG/ML injection 4 mg (4 mg Intravenous Given 03/11/22 1704)  iohexol (OMNIPAQUE) 300 MG/ML solution 100 mL (100 mLs Intravenous Contrast Given 03/11/22 1757)    ED Course/ Medical Decision Making/ A&P                             Medical Decision Making Amount and/or Complexity of Data Reviewed Labs: ordered. Radiology: ordered.  Risk Prescription drug management.   This patient presents to the ED for abdominal pain and nausea, this involves an extensive number of treatment options, and is a complaint that carries with a high risk of complications and morbidity.  The differential diagnosis includes aortic dissection, enteritis, cholecystitis, GERD, pancreatitis, pneumonia, PUD.  This is not an exhaustive list.  Lab tests: I ordered and personally interpreted labs.  The pertinent  results include: WBC unremarkable. Hbg unremarkable. Platelets unremarkable.  Potassium 3.3.  Calcium 8.5.  BUN, creatinine unremarkable. UA significant for no acute abnormality.   Imaging studies: I ordered imaging studies. I personally reviewed, interpreted imaging and agree with the radiologist's interpretations. The results include: CT scan of the abdomen pelvis with no acute abnormalities.  Problem list/ ED course/ Critical interventions/ Medical management: HPI: See above Vital signs within normal range and stable throughout visit. Laboratory/imaging studies significant for: See above. On physical examination, patient is afebrile and appears in no acute distress. Abdominal exam without peritoneal signs. No evidence of acute abdomen at this time. Well appearing. Given work up, low suspicion for acute hepatobiliary disease (including acute cholecystitis or cholangitis), acute pancreatitis (neg lipase), PUD (including gastric perforation), acute infectious processes (pneumonia, hepatitis, pyelonephritis), acute appendicitis, vascular  catastrophe, bowel obstruction, viscus perforation, or testicular torsion, diverticulitis. Presentation not consistent with other acute, emergent causes of abdominal pain at this time. Based on patient's clinical presentations and laboratory/imaging studies I suspect musculoskeletal pain.  I sent an Rx of naproxen and Zofran ODT for nausea.  Advised patient to look with PCP for further evaluation and management.  Return to the ER if new or worsening symptoms.. I have reviewed the patient home medicines and have made adjustments as needed.  Cardiac monitoring/EKG: The patient was maintained on a cardiac monitor.  I personally reviewed and interpreted the cardiac monitor which showed an underlying rhythm of: sinus rhythm.  Additional history obtained: External records from outside source obtained and reviewed including: Chart review including previous notes, labs,  imaging.  Consultations obtained:  Disposition Continued outpatient therapy. Follow-up with PCP recommended for reevaluation of symptoms. Treatment plan discussed with patient.  Pt acknowledged understanding was agreeable to the plan. Worrisome signs and symptoms were discussed with patient, and patient acknowledged understanding to return to the ED if they noticed these signs and symptoms. Patient was stable upon discharge.   This chart was dictated using voice recognition software.  Despite best efforts to proofread,  errors can occur which can change the documentation meaning.          Final Clinical Impression(s) / ED Diagnoses Final diagnoses:  Right upper quadrant abdominal pain    Rx / DC Orders ED Discharge Orders          Ordered    naproxen (NAPROSYN) 500 MG tablet  2 times daily        03/11/22 1844    ondansetron (ZOFRAN-ODT) 4 MG disintegrating tablet  Every 8 hours PRN        03/11/22 1844              Rex Kras, PA 03/11/22 2311    Sherwood Gambler, MD 03/12/22 1708

## 2022-03-11 NOTE — ED Notes (Signed)
Late Entry: 27yo female presents with worsening abd pain since onset this am. Has intermittent facial grimacing. No active N/V at this time. IV established and medicated per orders, explained care being provided, also instructed pt to remain NPO untirl further orders, also implemented safety measures secondary to rec IV pain meds. Mother at bedside

## 2022-03-11 NOTE — ED Notes (Signed)
Placed on cont POX monitoring with int NBP assessments, medicated per orders
